# Patient Record
Sex: Male | Born: 1970 | Race: White | Hispanic: No | Marital: Married | Smoking: Never smoker
Health system: Southern US, Community
[De-identification: ages and names within clinical notes are randomized; demographics above are authoritative.]

## PROBLEM LIST (undated history)

## (undated) DIAGNOSIS — N289 Disorder of kidney and ureter, unspecified: Secondary | ICD-10-CM

## (undated) HISTORY — PX: HERNIA REPAIR: SHX51

## (undated) HISTORY — PX: HAND SURGERY: SHX662

---

## 2008-12-01 ENCOUNTER — Ambulatory Visit (HOSPITAL_COMMUNITY): Admission: RE | Admit: 2008-12-01 | Discharge: 2008-12-01 | Payer: Self-pay | Admitting: Family Medicine

## 2015-08-28 ENCOUNTER — Emergency Department (HOSPITAL_COMMUNITY): Payer: Self-pay

## 2015-08-28 ENCOUNTER — Encounter (HOSPITAL_COMMUNITY): Payer: Self-pay | Admitting: Emergency Medicine

## 2015-08-28 ENCOUNTER — Emergency Department (HOSPITAL_COMMUNITY)
Admission: EM | Admit: 2015-08-28 | Discharge: 2015-08-28 | Disposition: A | Discharge status: Home / Self Care | Payer: Self-pay | Attending: Emergency Medicine | Admitting: Emergency Medicine

## 2015-08-28 DIAGNOSIS — N23 Unspecified renal colic: Secondary | ICD-10-CM | POA: Insufficient documentation

## 2015-08-28 HISTORY — DX: Disorder of kidney and ureter, unspecified: N28.9

## 2015-08-28 LAB — URINALYSIS, ROUTINE W REFLEX MICROSCOPIC
BILIRUBIN URINE: NEGATIVE
Glucose, UA: NEGATIVE mg/dL
Ketones, ur: NEGATIVE mg/dL
LEUKOCYTES UA: NEGATIVE
NITRITE: NEGATIVE
PH: 5.5 (ref 5.0–8.0)
SPECIFIC GRAVITY, URINE: 1.025 (ref 1.005–1.030)

## 2015-08-28 LAB — COMPREHENSIVE METABOLIC PANEL
ALT: 33 U/L (ref 17–63)
AST: 29 U/L (ref 15–41)
Albumin: 4.6 g/dL (ref 3.5–5.0)
Alkaline Phosphatase: 68 U/L (ref 38–126)
Anion gap: 13 (ref 5–15)
BUN: 18 mg/dL (ref 6–20)
CHLORIDE: 104 mmol/L (ref 101–111)
CO2: 25 mmol/L (ref 22–32)
Calcium: 9.3 mg/dL (ref 8.9–10.3)
Creatinine, Ser: 1.13 mg/dL (ref 0.61–1.24)
GFR calc Af Amer: >60 mL/min (ref >60)
GFR calc non Af Amer: >60 mL/min (ref >60)
GLUCOSE: 146 mg/dL — AB (ref 65–99)
Potassium: 3.6 mmol/L (ref 3.5–5.1)
SODIUM: 142 mmol/L (ref 135–145)
TOTAL PROTEIN: 7.7 g/dL (ref 6.5–8.1)
Total Bilirubin: 0.7 mg/dL (ref 0.3–1.2)

## 2015-08-28 LAB — CBC
HEMATOCRIT: 46.1 % (ref 39.0–52.0)
HEMOGLOBIN: 15.8 g/dL (ref 13.0–17.0)
MCH: 30 pg (ref 26.0–34.0)
MCHC: 34.3 g/dL (ref 30.0–36.0)
MCV: 87.6 fL (ref 78.0–100.0)
Platelets: 230 10*3/uL (ref 150–400)
RBC: 5.26 MIL/uL (ref 4.22–5.81)
RDW: 12.4 % (ref 11.5–15.5)
WBC: 15.8 10*3/uL — ABNORMAL HIGH (ref 4.0–10.5)

## 2015-08-28 LAB — URINE MICROSCOPIC-ADD ON
Squamous Epithelial / LPF: NONE SEEN
WBC UA: NONE SEEN WBC/hpf (ref 0–5)

## 2015-08-28 LAB — LIPASE, BLOOD: LIPASE: 25 U/L (ref 11–51)

## 2015-08-28 MED ORDER — HYDROCODONE-ACETAMINOPHEN 7.5-325 MG PO TABS: 1.0000 | ORAL_TABLET | ORAL | Status: AC | PRN

## 2015-08-28 MED ORDER — HYDROMORPHONE HCL 1 MG/ML IJ SOLN
0.5000 mg | Freq: Once | INTRAMUSCULAR | Status: AC
  Administered 2015-08-28: 0.5 mg via INTRAVENOUS
  Filled 2015-08-28: qty 1

## 2015-08-28 MED ORDER — KETOROLAC TROMETHAMINE 10 MG PO TABS: 10.0000 mg | ORAL_TABLET | Freq: Every day | ORAL | Status: AC

## 2015-08-28 MED ORDER — KETOROLAC TROMETHAMINE 30 MG/ML IJ SOLN
30.0000 mg | Freq: Once | INTRAMUSCULAR | Status: AC
  Administered 2015-08-28: 30 mg via INTRAVENOUS
  Filled 2015-08-28: qty 1

## 2015-08-28 MED ORDER — PROCHLORPERAZINE EDISYLATE 5 MG/ML IJ SOLN
5.0000 mg | Freq: Once | INTRAMUSCULAR | Status: AC
  Administered 2015-08-28: 5 mg via INTRAVENOUS
  Filled 2015-08-28: qty 2

## 2015-08-28 NOTE — Discharge Instructions (Signed)
Your CT scan suggest that you may have passed a kidney stone. Please increase fluids. Use toradol daily with a meal. Use norco for pain if needed. This medication may cause drowsiness, use with caution. Renal Colic Renal colic is pain that is caused by a kidney stone. HOME CARE  Take medicines only as told by your doctor.  Ask your doctor if it is okay to take over-the-counter medicine for pain.  Drink enough fluid to keep your pee (urine) clear or pale yellow. Drink 6-8 glasses of water each day.  Eat less than 2 grams of salt per day.  Eat less protein. Some foods that have protein are meats, fish, nuts, and dairy.  Try not to eat spinach, rhubarb, nuts, or bran. GET HELP IF:  You have a fever or chills.  Your pee smells bad or looks cloudy.  You have pain or burning when you pee. GET HELP RIGHT AWAY IF:  The pain in your side (flank) or your groin suddenly gets worse.  You get confused.  You pass out.   This information is not intended to replace advice given to you by your health care provider. Make sure you discuss any questions you have with your health care provider.   Document Released: 10/02/2007 Document Revised: 05/06/2014 Document Reviewed: 02/23/2014 Elsevier Interactive Patient Education [employer]

## 2015-08-28 NOTE — ED Notes (Signed)
Pt c/o RT sided flank pain that radiates to RT testicle, n/v, and dysuria. Pt also reports a syncopal episode walking to bathroom. States he did not hit his head. AOx4. Pt hx of kidney stone.

## 2015-08-28 NOTE — ED Provider Notes (Signed)
CSN: [id number]     Arrival date & time 08/28/15  0759 History   First MD Initiated Contact with Patient 08/28/15 0802     Chief Complaint  Patient presents with  . Flank Pain     (Consider location/radiation/quality/duration/timing/severity/associated sxs/prior Treatment) Patient is a 45 y.o. male presenting with flank pain. The history is provided by the patient.  Flank Pain This is a new problem. The current episode started today. The problem occurs constantly. The problem has been gradually worsening. Associated symptoms include fatigue and nausea. Pertinent negatives include no fever. Associated symptoms comments: Near syncope. Nothing aggravates the symptoms. He has tried nothing for the symptoms. The treatment provided no relief.    Past Medical History  Diagnosis Date  . Renal disorder     kidney stone   Past Surgical History  Procedure Laterality Date  . Hernia repair    . Hand surgery     No family history on file. Social History  Substance Use Topics  . Smoking status: Never Smoker   . Smokeless tobacco: None  . Alcohol Use: No    Review of Systems  Constitutional: Positive for fatigue. Negative for fever.  Gastrointestinal: Positive for nausea.  Genitourinary: Positive for flank pain.  All other systems reviewed and are negative.     Allergies  Sulfa antibiotics  Home Medications   Prior to Admission medications   Not on File   BP 156/99 mmHg  Pulse 60  Temp(Src) 97.9 F (36.6 C) (Oral)  Resp 18  Ht 5\' 9"  (1.753 m)  Wt 78.926 kg  BMI 25.68 kg/m2  SpO2 96% Physical Exam  Constitutional: He is oriented to person, place, and time. He appears well-developed and well-nourished.  Non-toxic appearance.  HENT:  Head: Normocephalic.  Right Ear: Tympanic membrane and external ear normal.  Left Ear: Tympanic membrane and external ear normal.  Eyes: EOM and lids are normal. Pupils are equal, round, and reactive to light.  Neck: Normal range of motion.  Neck supple. Carotid bruit is not present.  Cardiovascular: Normal rate, regular rhythm, normal heart sounds, intact distal pulses and normal pulses.   Pulmonary/Chest: Breath sounds normal. No respiratory distress.  Abdominal: Soft. Bowel sounds are normal. He exhibits no distension and no ascites. There is tenderness in the right lower quadrant. There is CVA tenderness. There is no guarding.  Mild to mod right CVAT  Musculoskeletal: Normal range of motion.  Lymphadenopathy:       Head (right side): No submandibular adenopathy present.       Head (left side): No submandibular adenopathy present.    He has no cervical adenopathy.  Neurological: He is alert and oriented to person, place, and time. He has normal strength. No cranial nerve deficit or sensory deficit.  Skin: Skin is warm and dry.  Psychiatric: He has a normal mood and affect. His speech is normal.  Nursing note and vitals reviewed.   ED Course  Procedures (including critical care time) Labs Review Labs Reviewed  LIPASE, BLOOD  COMPREHENSIVE METABOLIC PANEL  CBC  URINALYSIS, ROUTINE W REFLEX MICROSCOPIC ([facility])    Imaging Review No results found. I have personally reviewed and evaluated these images and lab results as part of my medical decision-making.   EKG Interpretation None      MDM Vital signs stable. The urine test is negative for acute infection. The Cmet is negative for acute problem. CT stone study suggest stranding at the right ureter. No urinary stone noted  at this time. Pt states that when he went to the bathroom a few min ago he note a small black stone present.  CT also suggest small nodule in the right lower lung. Pt will see his primary MD for follow up of this nodule. Pt to see urology or return to ED if any changes or problem.   Final diagnoses:  Ureteral colic    *I have reviewed nursing notes, vital signs, and all appropriate lab and imaging results for this patient.**I personally  performed the services described in this documentation, which was scribed in my presence. The recorded information has been reviewed and is accurate.    Ivery Quale, PA-C 08/30/15 1150  Eber Hong, MD 08/31/15 [phone]

## 2017-03-22 IMAGING — CT CT RENAL STONE PROTOCOL
3 of 4 series · 9 of 46 positions shown, 16 images · non-contrast
Comparison: CT abdomen and pelvis 01/01/2011.

CLINICAL DATA: Right flank pain beginning at 5 a.m. this morning.
Initial encounter.

EXAM:
CT ABDOMEN AND PELVIS WITHOUT CONTRAST
TECHNIQUE: Multidetector CT imaging of the abdomen and pelvis was performed
following the standard protocol without IV contrast.

[Series 3: mpr coronal (id) · coronal · 0.82mm/px · 3 of 102 slices shown, 4 images]
[im 34/102  soft-tissue]
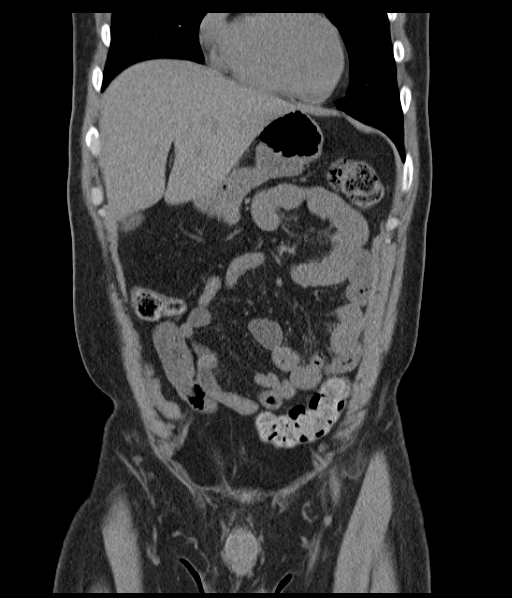
[im 45/102  soft-tissue]
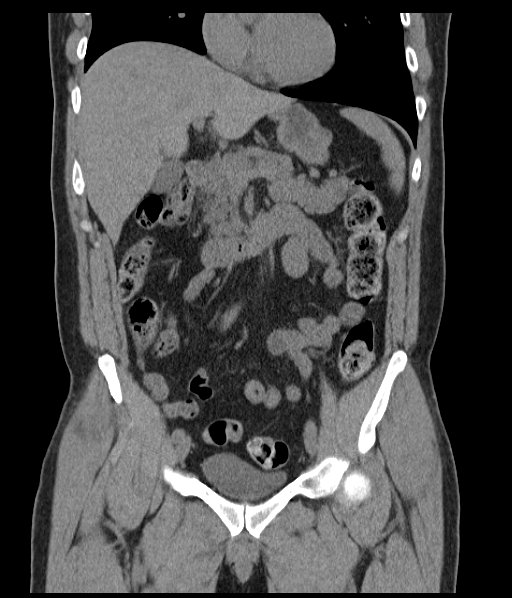
[im 45/102  bone]
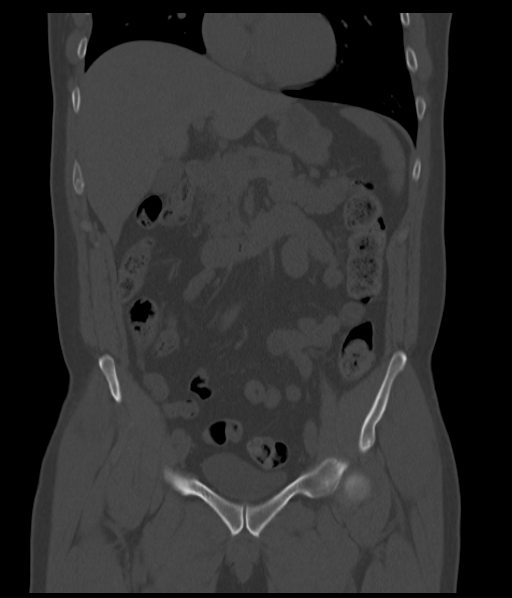
[im 57/102  soft-tissue]
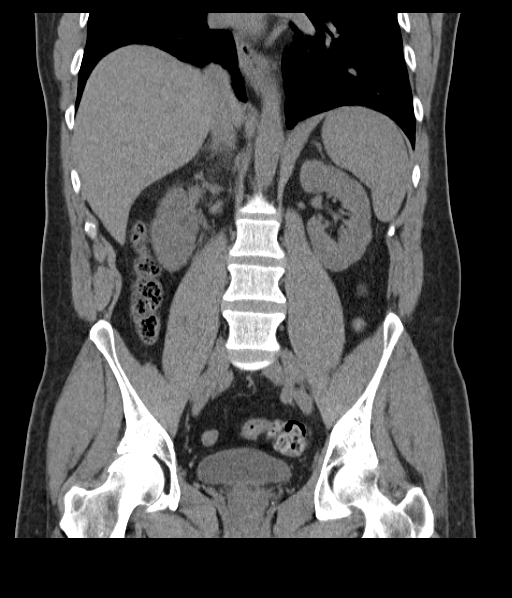

[Series 4: mpr sagittal (id) · sagittal · 0.63mm/px · 1 of 119 slices shown, 2 images]
[im 40/119  soft-tissue]
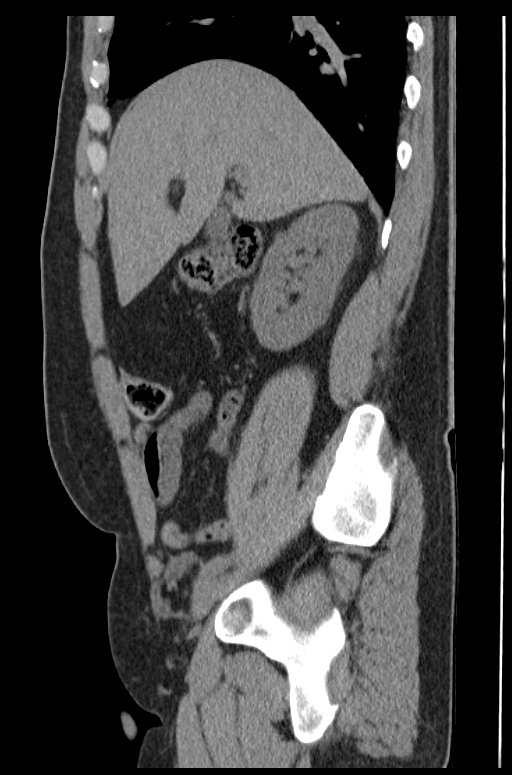
[im 40/119  bone]
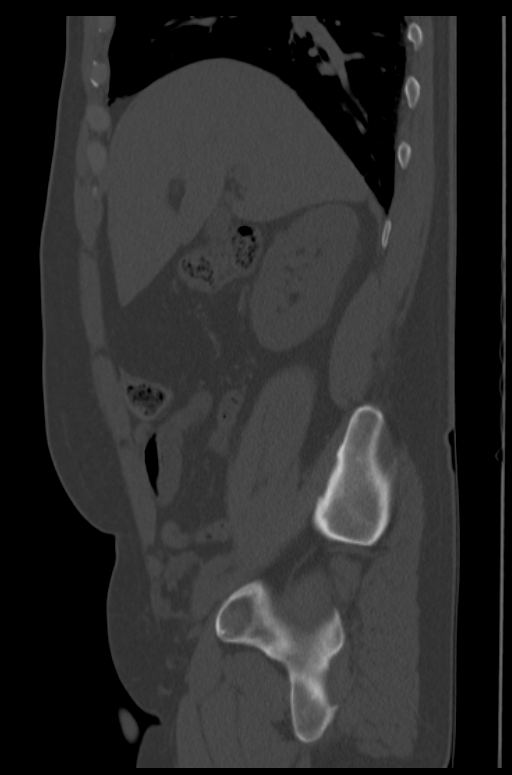

[Series 6: lung 5.0 b60f · axial · 0.73mm/px · z∈[-124,-34]mm · 5 of 28 slices shown, 10 images]
[im 5/28  soft-tissue]
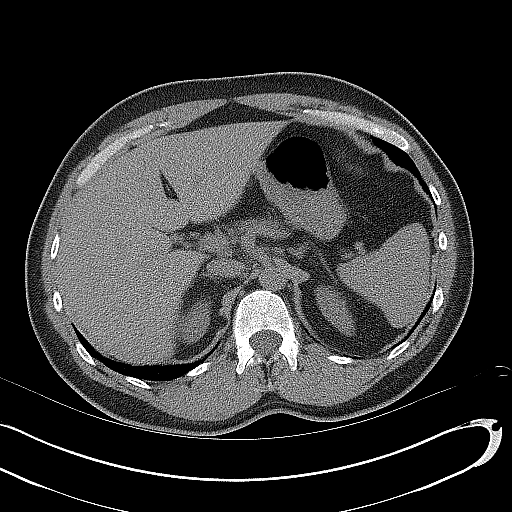
[im 5/28  bone]
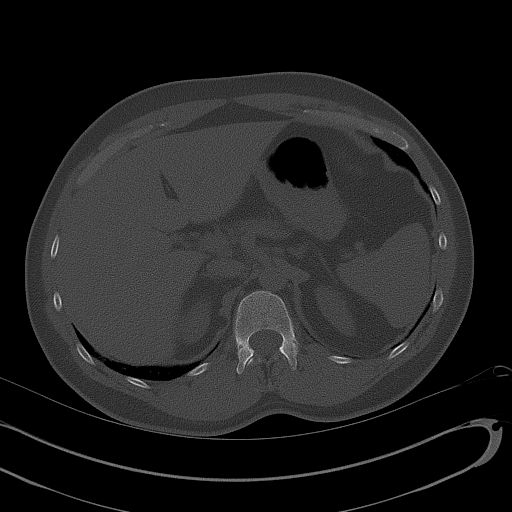
[im 10/28  soft-tissue]
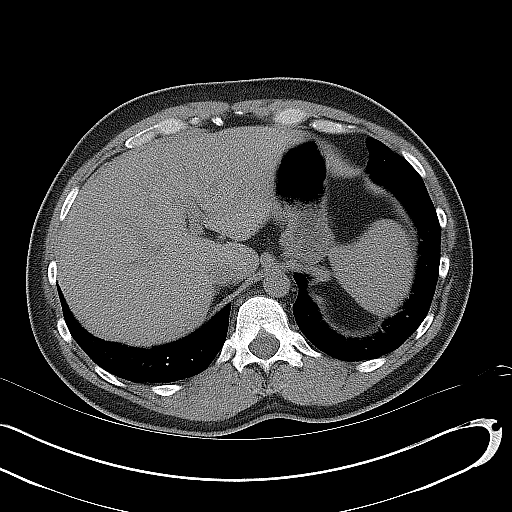
[im 10/28  lung]
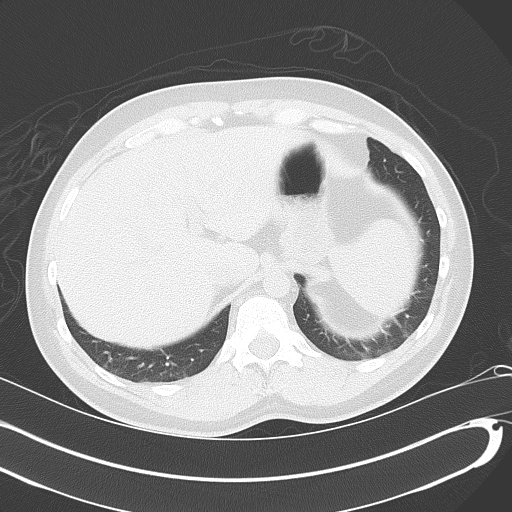
[im 14/28  soft-tissue]
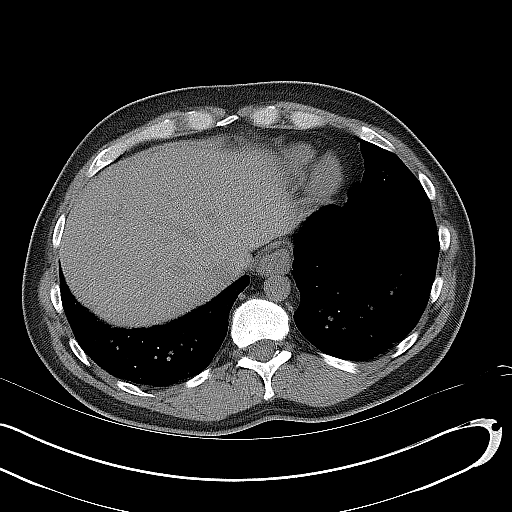
[im 14/28  lung]
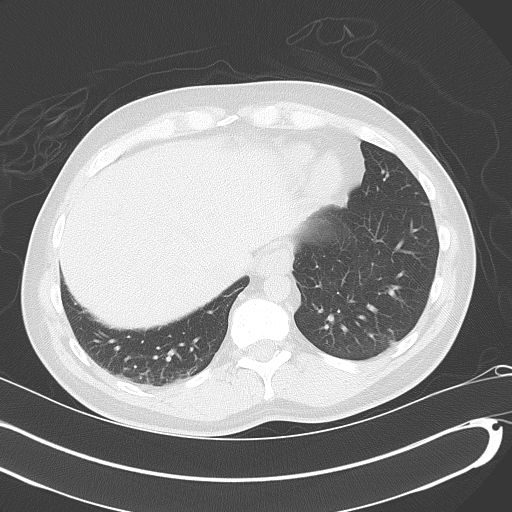
[im 19/28  soft-tissue]
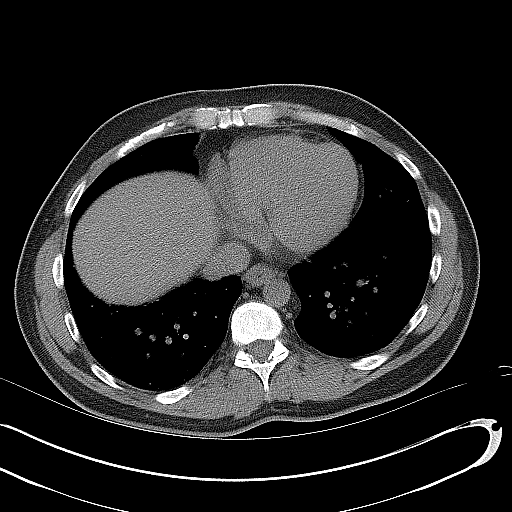
[im 19/28  lung]
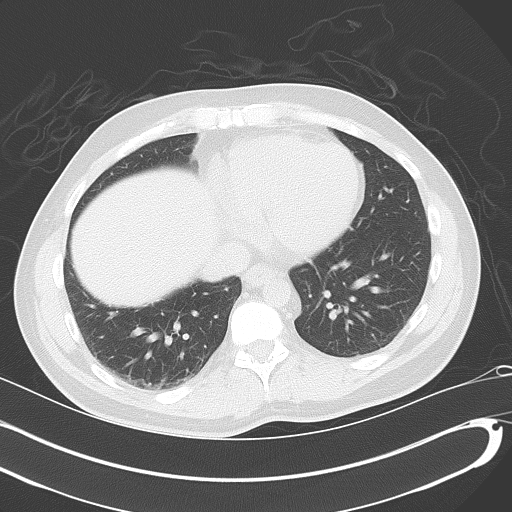
[im 23/28  soft-tissue]
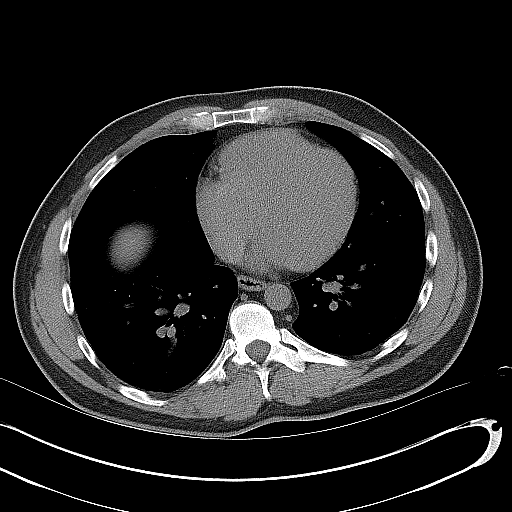
[im 23/28  lung]
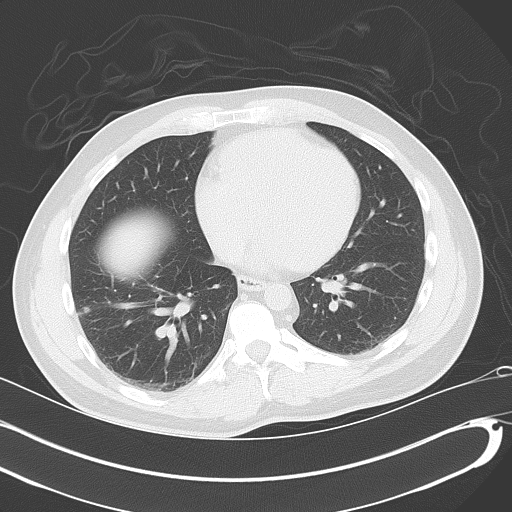

[9 of 46 positions shown; findings below may reference images not displayed]

FINDINGS: A 0.3 cm nodule is seen in the right lower lobe on the first image.
Mild dependent atelectasis is noted in the lung bases. No pleural or
pericardial effusion.

No renal or ureteral stones are identified and there is no
hydronephrosis on the right or left. Mild stranding is seen about
the proximal right ureter. The urinary bladder, seminal vesicles and
prostate gland are unremarkable.

The gallbladder, liver, spleen, adrenal glands and pancreas all
appear normal. There is no lymphadenopathy or fluid.

The stomach, small and large bowel and appendix appear normal.

No bony abnormality is seen.
IMPRESSION: Stranding about the proximal right ureter could be due to recent
stone passage or infectious or inflammatory process. The exam is
negative for urinary tract stones.

0.3 cm right lower lobe pulmonary nodule. No follow-up needed if
patient is low-risk. Non-contrast chest CT can be considered in 12
months if patient is high-risk. This recommendation follows the
consensus statement: Guidelines for Management of Incidental
Pulmonary Nodules Detected on CT Images:From the [HOSPITAL]
5637; published online before print (10.1148/radiol.4883828290).
